# Patient Record
Sex: Male | Born: 2005
Health system: Southern US, Community
[De-identification: ages and names within clinical notes are randomized; demographics above are authoritative.]

## PROBLEM LIST (undated history)

## (undated) DIAGNOSIS — K5900 Constipation, unspecified: Secondary | ICD-10-CM

## (undated) DIAGNOSIS — R519 Headache, unspecified: Secondary | ICD-10-CM

## (undated) DIAGNOSIS — F4310 Post-traumatic stress disorder, unspecified: Secondary | ICD-10-CM

## (undated) DIAGNOSIS — U071 COVID-19: Secondary | ICD-10-CM

## (undated) HISTORY — PX: TOENAIL EXCISION: SHX183

## (undated) HISTORY — DX: Constipation, unspecified: K59.00

## (undated) HISTORY — DX: Post-traumatic stress disorder, unspecified: F43.10

## (undated) HISTORY — DX: Headache, unspecified: R51.9

## (undated) HISTORY — DX: COVID-19: U07.1

---
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
## 2024-04-02 ENCOUNTER — Other Ambulatory Visit (HOSPITAL_COMMUNITY): Payer: Self-pay | Admitting: Psychiatry

## 2024-04-12 ENCOUNTER — Telehealth: Payer: Self-pay

## 2024-04-12 ENCOUNTER — Ambulatory Visit: Payer: Self-pay | Admitting: Family Medicine

## 2024-04-12 ENCOUNTER — Encounter: Payer: Self-pay | Admitting: Family Medicine

## 2024-04-12 VITALS — BP 110/60 | HR 100 | Temp 98.2°F | Wt 147.4 lb

## 2024-04-12 DIAGNOSIS — F321 Major depressive disorder, single episode, moderate: Secondary | ICD-10-CM

## 2024-04-12 DIAGNOSIS — Z23 Encounter for immunization: Secondary | ICD-10-CM

## 2024-04-12 MED ORDER — OXCARBAZEPINE 150 MG PO TABS: 150.0000 mg | ORAL_TABLET | Freq: Two times a day (BID) | ORAL | 2 refills | Status: AC

## 2024-04-12 MED ORDER — OXCARBAZEPINE 300 MG PO TABS: 600.0000 mg | ORAL_TABLET | Freq: Two times a day (BID) | ORAL | 2 refills | Status: AC

## 2024-04-12 MED ORDER — OXCARBAZEPINE 300 MG PO TABS: 600.0000 mg | ORAL_TABLET | Freq: Two times a day (BID) | ORAL | 2 refills | Status: DC

## 2024-04-12 MED ORDER — OXCARBAZEPINE 150 MG PO TABS: 150.0000 mg | ORAL_TABLET | Freq: Two times a day (BID) | ORAL | 2 refills | Status: DC

## 2024-04-12 NOTE — Telephone Encounter (Signed)
 Copied from CRM #[id number]. Topic: Clinical - Prescription Issue >> Apr 12, 2024  9:54 AM Larissa RAMAN wrote: Reason for CRM: Patient is requesting to have pharmacy changed for medications, OXcarbazepine  (TRILEPTAL ) 150 MG tablet and Oxcarbazepine  (TRILEPTAL ) 300 MG tablet. Send prescriptions to:   [facility] [address], [place] - [address] [place] [address] Phone: [phone] Fax: [phone] Hours: Not open 24 hours

## 2024-04-12 NOTE — Progress Notes (Unsigned)
 He had been off triletal.  He has been off for about 2 weeks.  He couldn't get follow up with psych at this point but he is going to get seen there later this year.  Med prev helped with mood and anger.  Mood is worse off med.  Lack of motivation.  No SI/HI.   D/w pt about MJ, daily use.  No other illicits.  Rare cigar but not daily cigarettes.    Flu shot today.    Prev was on trileptal  750mg  BID.  On zero dose now.

## 2024-04-12 NOTE — Telephone Encounter (Signed)
 Sent. Thanks.

## 2024-04-12 NOTE — Addendum Note (Signed)
 Addended by: CLEATUS ARLYSS RAMAN on: 04/12/2024 02:10 PM   Modules accepted: Orders

## 2024-04-12 NOTE — Patient Instructions (Signed)
 Trileptal  dose, increase every 2-3 days.    150mg  twice a day.  300mg  twice a day.  300+150mg  twice a day.  2 of the 300mg  twice a day.  150 and 2 of the 300mg  twice a day.   Please call about psychiatry follow up.  Update me as needed.  Take care.  Glad to see you.

## 2024-04-14 NOTE — Assessment & Plan Note (Signed)
 Discussed gradual restart of Trileptal . Can increase every 2-3 days.    150mg  twice a day.  300mg  twice a day.  300+150mg  twice a day.  2 of the 300mg  twice a day.  150 and 2 of the 300mg  twice a day.   Please call about psychiatry follow up.  Update me as needed.  He agrees to plan.  Okay for outpatient follow-up.
